# Patient Record
Sex: Male | Born: 1950
Health system: Southern US, Community
[De-identification: ages and names within clinical notes are randomized; demographics above are authoritative.]

## PROBLEM LIST (undated history)

## (undated) DIAGNOSIS — E785 Hyperlipidemia, unspecified: Secondary | ICD-10-CM

## (undated) DIAGNOSIS — I10 Essential (primary) hypertension: Secondary | ICD-10-CM

## (undated) HISTORY — DX: Hyperlipidemia, unspecified: E78.5

## (undated) HISTORY — DX: Essential (primary) hypertension: I10

---

## 2000-07-11 ENCOUNTER — Ambulatory Visit (HOSPITAL_COMMUNITY): Admission: RE | Admit: 2000-07-11 | Discharge: 2000-07-11 | Payer: Self-pay | Admitting: *Deleted

## 2004-07-15 ENCOUNTER — Emergency Department (HOSPITAL_COMMUNITY): Admission: EM | Admit: 2004-07-15 | Discharge: 2004-07-15 | Payer: Self-pay | Admitting: Emergency Medicine

## 2004-11-18 ENCOUNTER — Emergency Department (HOSPITAL_COMMUNITY): Admission: EM | Admit: 2004-11-18 | Discharge: 2004-11-18 | Payer: Self-pay | Admitting: Emergency Medicine

## 2014-10-16 ENCOUNTER — Encounter (HOSPITAL_COMMUNITY): Payer: Self-pay | Admitting: Emergency Medicine

## 2014-10-16 ENCOUNTER — Emergency Department (HOSPITAL_COMMUNITY)
Admission: EM | Admit: 2014-10-16 | Discharge: 2014-10-16 | Disposition: A | Discharge status: Home / Self Care | Payer: 59 | Attending: Emergency Medicine | Admitting: Emergency Medicine

## 2014-10-16 DIAGNOSIS — L739 Follicular disorder, unspecified: Secondary | ICD-10-CM | POA: Insufficient documentation

## 2014-10-16 DIAGNOSIS — L02413 Cutaneous abscess of right upper limb: Secondary | ICD-10-CM | POA: Diagnosis not present

## 2014-10-16 DIAGNOSIS — Z720 Tobacco use: Secondary | ICD-10-CM | POA: Diagnosis not present

## 2014-10-16 DIAGNOSIS — L0291 Cutaneous abscess, unspecified: Secondary | ICD-10-CM

## 2014-10-16 DIAGNOSIS — B356 Tinea cruris: Secondary | ICD-10-CM | POA: Diagnosis not present

## 2014-10-16 DIAGNOSIS — R21 Rash and other nonspecific skin eruption: Secondary | ICD-10-CM | POA: Diagnosis present

## 2014-10-16 MED ORDER — SODIUM BICARBONATE 4 % IV SOLN
5.0000 mL | Freq: Once | INTRAVENOUS | Status: AC
  Administered 2014-10-16: 5 mL via SUBCUTANEOUS
  Filled 2014-10-16: qty 5

## 2014-10-16 MED ORDER — TRAMADOL HCL 50 MG PO TABS: 50.0000 mg | ORAL_TABLET | Freq: Four times a day (QID) | ORAL | Status: DC | PRN

## 2014-10-16 MED ORDER — SULFAMETHOXAZOLE-TRIMETHOPRIM 800-160 MG PO TABS: 1.0000 | ORAL_TABLET | Freq: Two times a day (BID) | ORAL | Status: DC

## 2014-10-16 MED ORDER — TERBINAFINE HCL 1 % EX CREA: 1.0000 "application " | TOPICAL_CREAM | Freq: Two times a day (BID) | CUTANEOUS | Status: DC

## 2014-10-16 MED ORDER — CEPHALEXIN 500 MG PO CAPS: 500.0000 mg | ORAL_CAPSULE | Freq: Four times a day (QID) | ORAL | Status: DC

## 2014-10-16 MED ORDER — LIDOCAINE HCL (PF) 1 % IJ SOLN
10.0000 mL | Freq: Once | INTRAMUSCULAR | Status: AC
  Administered 2014-10-16: 10 mL
  Filled 2014-10-16: qty 10

## 2014-10-16 NOTE — ED Provider Notes (Signed)
CSN: [id number]     Arrival date & time 10/16/14  6387 History   First MD Initiated Contact with Patient 10/16/14 1021     Chief Complaint  Patient presents with  . Rash  . Abscess     (Consider location/radiation/quality/duration/timing/severity/associated sxs/prior Treatment) HPI Comments: Patient with c/o rash and abscess. Pmh of abscess and similar rash. Onset 1 week for both. Rash is located in the groin and is itchy, Patient thinks it's jock itch. He has not treated. He has not treated the rash before today. He has also developed associated painful pustules on the upper thighs. He has had similar rashes before Patient also c/o abscess of the R wrist. Developing over the past week. He states it began as a small pimple on the arm, but has been calm enlarged, painful with a dark center and has swelling extending around the wrist. He has a history of previous boils. He is unsure if he has a history of MRSA. He denies any known injuries, IV drug use, insect bites. He denies fevers, chills, nausea, vomiting, myalgias or other symptoms of systemic infection.  Patient is a 64 y.o. male presenting with rash and abscess. The history is provided by the patient.  Rash Location:  Ano-genital and leg Ano-genital rash location:  Groin Leg rash location:  L upper leg and R upper leg Quality: itchiness and redness   Severity:  Moderate Onset quality:  Gradual Duration:  1 week Timing:  Constant Progression:  Worsening Chronicity:  Recurrent Context: not animal contact, not chemical exposure, not diapers, not eggs, not exposure to similar rash, not food, not hot tub use, not insect bite/sting, not medications, not new detergent/soap, not nuts, not plant contact, not pollen, not pregnancy, not sick contacts and not sun exposure   Relieved by:  None tried Worsened by:  Nothing tried Associated symptoms: no abdominal pain, no diarrhea, no fatigue, no fever, no headaches, no hoarse voice, no  induration, no joint pain, no nausea, no shortness of breath, no sore throat, no throat swelling, no tongue swelling, no URI, not vomiting and not wheezing   Abscess Location:  Shoulder/arm Shoulder/arm abscess location:  R wrist Size:  2 cm Abscess quality: draining, fluctuance, induration, itching, painful, redness, warmth and weeping   Red streaking: no   Duration:  1 week Progression:  Worsening Pain details:    Quality:  Aching and pressure   Severity:  Moderate   Duration:  1 week   Timing:  Constant   Progression:  Worsening Chronicity:  New Context: not diabetes, not immunosuppression, not injected drug use, not insect bite/sting and not skin injury   Relieved by:  None tried Worsened by:  Nothing tried Ineffective treatments:  None tried Associated symptoms: no fatigue, no fever, no headaches, no nausea and no vomiting   Risk factors: prior abscess     History reviewed. No pertinent past medical history. History reviewed. No pertinent past surgical history. No family history on file. History  Substance Use Topics  . Smoking status: Current Every Day Smoker  . Smokeless tobacco: Not on file  . Alcohol Use: Yes    Review of Systems  Constitutional: Negative for fever and fatigue.  HENT: Negative for hoarse voice and sore throat.   Respiratory: Negative for shortness of breath and wheezing.   Gastrointestinal: Negative for nausea, vomiting, abdominal pain and diarrhea.  Musculoskeletal: Negative for arthralgias.  Skin: Positive for rash.  Neurological: Negative for headaches.  All other systems reviewed and  are negative.     Allergies  Review of patient's allergies indicates not on file.  Home Medications   Prior to Admission medications   Not on File   BP 149/77 mmHg  Pulse 76  Temp(Src) 98.1 F (36.7 C) (Oral)  Resp 16  Ht  (1.803 m)  Wt 175 lb (79.379 kg)  BMI 24.42 kg/m2  SpO2 98% Physical Exam  Constitutional: He appears well-developed  and well-nourished. No distress.  HENT:  Head: Normocephalic and atraumatic.  Eyes: Conjunctivae are normal. No scleral icterus.  Neck: Normal range of motion. Neck supple.  Cardiovascular: Normal rate, regular rhythm and normal heart sounds.   Pulmonary/Chest: Effort normal and breath sounds normal. No respiratory distress.  Abdominal: Soft. There is no tenderness.  Musculoskeletal: He exhibits no edema.       Right hand: Normal. He exhibits normal range of motion, no tenderness, normal capillary refill and no swelling. Normal sensation noted. Normal strength noted.  Neurological: He is alert.  Skin: Skin is warm and dry. He is not diaphoretic.     Psychiatric: His behavior is normal.  Nursing note and vitals reviewed.   ED Course  INCISION AND DRAINAGE Date/Time: 10/16/2014 12:44 PM Performed by: Arthor Captain Authorized by: Arthor Captain Consent: Verbal consent obtained. Risks and benefits: risks, benefits and alternatives were discussed Patient identity confirmed: provided demographic data Time out: Immediately prior to procedure a "time out" was called to verify the correct patient, procedure, equipment, support staff and site/side marked as required. Type: abscess Body area: upper extremity Location details: right wrist Anesthesia: local infiltration Local anesthetic: NaHCO3 (sodium bicarbonate) and lidocaine 1% without epinephrine Anesthetic total: 6 ml Patient sedated: no Scalpel size: 11 Incision type: single straight Complexity: simple Drainage characteristics: sebaceous and pururlent. Drainage amount: moderate Wound treatment: wound left open Packing material: none Patient tolerance: Patient tolerated the procedure well with no immediate complications   (including critical care time) Labs Review Labs Reviewed - No data to display  Imaging Review No results found.   EKG Interpretation None      MDM   Final diagnoses:  Abscess  Folliculitis    Tinea cruris    Patient with what appears to be staph infection of the thighs and tinea cruris. Abscess drained culture obtained and sent for eval. Lamisil, keflex, bactrim at discharge. Discussed return precautions     Arthor Captain, PA-C 10/18/14 0900  Jerelyn Scott, MD 10/18/14 [phone]

## 2014-10-16 NOTE — Discharge Instructions (Signed)
Abscess °An abscess is an infected area that contains a collection of pus and debris. It can occur in almost any part of the body. An abscess is also known as a furuncle or boil. °CAUSES  °An abscess occurs when tissue gets infected. This can occur from blockage of oil or sweat glands, infection of hair follicles, or a minor injury to the skin. As the body tries to fight the infection, pus collects in the area and creates pressure under the skin. This pressure causes pain. People with weakened immune systems have difficulty fighting infections and get certain abscesses more often.  °SYMPTOMS °Usually an abscess develops on the skin and becomes a painful mass that is red, warm, and tender. If the abscess forms under the skin, you may feel a moveable soft area under the skin. Some abscesses break open (rupture) on their own, but most will continue to get worse without care. The infection can spread deeper into the body and eventually into the bloodstream, causing you to feel ill.  °DIAGNOSIS  °Your caregiver will take your medical history and perform a physical exam. A sample of fluid may also be taken from the abscess to determine what is causing your infection. °TREATMENT  °Your caregiver may prescribe antibiotic medicines to fight the infection. However, taking antibiotics alone usually does not cure an abscess. Your caregiver may need to make a small cut (incision) in the abscess to drain the pus. In some cases, gauze is packed into the abscess to reduce pain and to continue draining the area. °HOME CARE INSTRUCTIONS  °· Only take over-the-counter or prescription medicines for pain, discomfort, or fever as directed by your caregiver. °· If you were prescribed antibiotics, take them as directed. Finish them even if you start to feel better. °· If gauze is used, follow your caregiver's directions for changing the gauze. °· To avoid spreading the infection: °· Keep your draining abscess covered with a  bandage. °· Wash your hands well. °· Do not share personal care items, towels, or whirlpools with others. °· Avoid skin contact with others. °· Keep your skin and clothes clean around the abscess. °· Keep all follow-up appointments as directed by your caregiver. °SEEK MEDICAL CARE IF:  °· You have increased pain, swelling, redness, fluid drainage, or bleeding. °· You have muscle aches, chills, or a general ill feeling. °· You have a fever. °MAKE SURE YOU:  °· Understand these instructions. °· Will watch your condition. °· Will get help right away if you are not doing well or get worse. °Document Released: 12/13/2004 Document Revised: 09/04/2011 Document Reviewed: 05/18/2011 °[employer]® Patient Information ©2015 [employer]. This information is not intended to replace advice given to you by your health care provider. Make sure you discuss any questions you have with your health care provider. ° °Cellulitis °Cellulitis is an infection of the skin and the tissue beneath it. The infected area is usually red and tender. Cellulitis occurs most often in the arms and lower legs.  °CAUSES  °Cellulitis is caused by bacteria that enter the skin through cracks or cuts in the skin. The most common types of bacteria that cause cellulitis are staphylococci and streptococci. °SIGNS AND SYMPTOMS  °· Redness and warmth. °· Swelling. °· Tenderness or pain. °· Fever. °DIAGNOSIS  °Your health care provider can usually determine what is wrong based on a physical exam. Blood tests may also be done. °TREATMENT  °Treatment usually involves taking an antibiotic medicine. °HOME CARE INSTRUCTIONS  °· Take your antibiotic   medicine as directed by your health care provider. Finish the antibiotic even if you start to feel better.  Keep the infected arm or leg elevated to reduce swelling.  Apply a warm cloth to the affected area up to 4 times per day to relieve pain.  Take medicines only as directed by your health care provider.  Keep all  follow-up visits as directed by your health care provider. SEEK MEDICAL CARE IF:   You notice red streaks coming from the infected area.  Your red area gets larger or turns dark in color.  Your bone or joint underneath the infected area becomes painful after the skin has healed.  Your infection returns in the same area or another area.  You notice a swollen bump in the infected area.  You develop new symptoms.  You have a fever. SEEK IMMEDIATE MEDICAL CARE IF:   You feel very sleepy.  You develop vomiting or diarrhea.  You have a general ill feeling (malaise) with muscle aches and pains. MAKE SURE YOU:   Understand these instructions.  Will watch your condition.  Will get help right away if you are not doing well or get worse. Document Released: 12/13/2004 Document Revised: 07/20/2013 Document Reviewed: 05/21/2011 [facility] Patient Information 2015 [place], [place]. This information is not intended to replace advice given to you by your health care provider. Make sure you discuss any questions you have with your health care provider.  Folliculitis  Folliculitis is redness, soreness, and swelling (inflammation) of the hair follicles. This condition can occur anywhere on the body. People with weakened immune systems, diabetes, or obesity have a greater risk of getting folliculitis. CAUSES  Bacterial infection. This is the most common cause.  Fungal infection.  Viral infection.  Contact with certain chemicals, especially oils and tars. Long-term folliculitis can result from bacteria that live in the nostrils. The bacteria may trigger multiple outbreaks of folliculitis over time. SYMPTOMS Folliculitis most commonly occurs on the scalp, thighs, legs, back, buttocks, and areas where hair is shaved frequently. An early sign of folliculitis is a small, white or yellow, pus-filled, itchy lesion (pustule). These lesions appear on a red, inflamed follicle. They are usually less than  0.2 inches (5 mm) wide. When there is an infection of the follicle that goes deeper, it becomes a boil or furuncle. A group of closely packed boils creates a larger lesion (carbuncle). Carbuncles tend to occur in hairy, sweaty areas of the body. DIAGNOSIS  Your caregiver can usually tell what is wrong by doing a physical exam. A sample may be taken from one of the lesions and tested in a lab. This can help determine what is causing your folliculitis. TREATMENT  Treatment may include:  Applying warm compresses to the affected areas.  Taking antibiotic medicines orally or applying them to the skin.  Draining the lesions if they contain a large amount of pus or fluid.  Laser hair removal for cases of long-lasting folliculitis. This helps to prevent regrowth of the hair. HOME CARE INSTRUCTIONS  Apply warm compresses to the affected areas as directed by your caregiver.  If antibiotics are prescribed, take them as directed. Finish them even if you start to feel better.  You may take over-the-counter medicines to relieve itching.  Do not shave irritated skin.  Follow up with your caregiver as directed. SEEK IMMEDIATE MEDICAL CARE IF:   You have increasing redness, swelling, or pain in the affected area.  You have a fever. MAKE SURE YOU:  Understand  these instructions.  Will watch your condition.  Will get help right away if you are not doing well or get worse. Document Released: 05/14/2001 Document Revised: 09/04/2011 Document Reviewed: 06/05/2011 [facility] Patient Information 2015 [place], [place]. This information is not intended to replace advice given to you by your health care provider. Make sure you discuss any questions you have with your health care provider.  Incision and Drainage Incision and drainage is a procedure in which a sac-like structure (cystic structure) is opened and drained. The area to be drained usually contains material such as pus, fluid, or blood.  LET YOUR  CAREGIVER KNOW ABOUT:   Allergies to medicine.  Medicines taken, including vitamins, herbs, eyedrops, over-the-counter medicines, and creams.  Use of steroids (by mouth or creams).  Previous problems with anesthetics or numbing medicines.  History of bleeding problems or blood clots.  Previous surgery.  Other health problems, including diabetes and kidney problems.  Possibility of pregnancy, if this applies. RISKS AND COMPLICATIONS  Pain.  Bleeding.  Scarring.  Infection. BEFORE THE PROCEDURE  You may need to have an ultrasound or other imaging tests to see how large or deep your cystic structure is. Blood tests may also be used to determine if you have an infection or how severe the infection is. You may need to have a tetanus shot. PROCEDURE  The affected area is cleaned with a cleaning fluid. The cyst area will then be numbed with a medicine (local anesthetic). A small incision will be made in the cystic structure. A syringe or catheter may be used to drain the contents of the cystic structure, or the contents may be squeezed out. The area will then be flushed with a cleansing solution. After cleansing the area, it is often gently packed with a gauze or another wound dressing. Once it is packed, it will be covered with gauze and tape or some other type of wound dressing. AFTER THE PROCEDURE   Often, you will be allowed to go home right after the procedure.  You may be given antibiotic medicine to prevent or heal an infection.  If the area was packed with gauze or some other wound dressing, you will likely need to come back in 1 to 2 days to get it removed.  The area should heal in about 14 days. Document Released: 08/29/2000 Document Revised: 09/04/2011 Document Reviewed: 04/30/2011 [facility] Patient Information 2015 [place], [place]. This information is not intended to replace advice given to you by your health care provider. Make sure you discuss any questions you have  with your health care provider.  Jock Itch Jock itch is a fungal infection of the skin in the groin area. It is sometimes called "ringworm" even though it is not caused by a worm. A fungus is a type of germ that thrives in dark, damp places.  CAUSES  This infection may spread from:  A fungus infection elsewhere on the body (such as athlete's foot).  Sharing towels or clothing. This infection is more common in:  Hot, humid climates.  People who wear tight-fitting clothing or wet bathing suits for long periods of time.  Athletes.  Overweight people.  People with diabetes. SYMPTOMS  Jock itch causes the following symptoms:  Red, pink or brown rash in the groin. Rash may spread to the thighs, anus, and buttocks.  Itching. DIAGNOSIS  Your caregiver may make the diagnosis by looking at the rash. Sometimes a skin scraping will be sent to test for fungus. Testing can be  done either by looking under the microscope or by doing a culture (test to try to grow the fungus). A culture can take up to 2 weeks to come back. TREATMENT  Jock itch may be treated with:  Skin cream or ointment to kill fungus.  Medicine by mouth to kill fungus.  Skin cream or ointment to calm the itching.  Compresses or medicated powders to dry the infected skin. HOME CARE INSTRUCTIONS   Be sure to treat the rash completely. Follow your caregiver's instructions. It can take a couple of weeks to treat. If you do not treat the infection long enough, the rash can come back.  Wear loose-fitting clothing.  Men should wear cotton boxer shorts.  Women should wear cotton underwear.  Avoid hot baths.  Dry the groin area well after bathing. SEEK MEDICAL CARE IF:   Your rash is worse.  Your rash is spreading.  Your rash returns after treatment is finished.  Your rash is not gone in 4 weeks. Fungal infections are slow to respond to treatment. Some redness may remain for several weeks after the fungus is  gone. SEEK IMMEDIATE MEDICAL CARE IF:  The area becomes red, warm, tender, and swollen.  You have a fever. Document Released: 02/23/2002 Document Revised: 05/28/2011 Document Reviewed: 01/23/2008 [facility] Patient Information 2015 [place], [place]. This information is not intended to replace advice given to you by your health care provider. Make sure you discuss any questions you have with your health care provider.

## 2014-10-16 NOTE — ED Notes (Signed)
Pt. Stated, I have this rash on my inner legs and I have a boil on my rt. wrist.

## 2014-10-19 LAB — CULTURE, ROUTINE-ABSCESS

## 2014-10-20 ENCOUNTER — Telehealth (HOSPITAL_COMMUNITY): Payer: Self-pay

## 2014-10-20 NOTE — Telephone Encounter (Signed)
Post ED Visit - Positive Culture Follow-up  Culture report reviewed by antimicrobial stewardship pharmacist:  Wes Dulaney, Pharm.D., BCPS  Celedonio Miyamoto, [address].D., BCPS  Georgina Pillion, Pharm.D., BCPS  [place], [address].D., BCPS, AAHIVP  Estella Husk, Pharm.D., BCPS, AAHIVP  Elder Cyphers, [address].D., BCPS X  [facility]  Positive absces culture Treated with bactrim ds, organism sensitive to the same and no further patient follow-up is required at this time.  Ashley Jacobs 10/20/2014, 8:38 AM

## 2018-10-31 ENCOUNTER — Other Ambulatory Visit: Payer: Self-pay | Admitting: Family Medicine

## 2018-10-31 DIAGNOSIS — Z136 Encounter for screening for cardiovascular disorders: Secondary | ICD-10-CM

## 2018-11-14 ENCOUNTER — Ambulatory Visit
Admission: RE | Admit: 2018-11-14 | Discharge: 2018-11-14 | Disposition: A | Discharge status: Home / Self Care | Payer: Medicare Other | Source: Ambulatory Visit | Attending: Family Medicine | Admitting: Family Medicine

## 2018-11-14 DIAGNOSIS — Z136 Encounter for screening for cardiovascular disorders: Secondary | ICD-10-CM

## 2018-12-15 ENCOUNTER — Telehealth: Payer: Self-pay | Admitting: Cardiology

## 2018-12-15 NOTE — Telephone Encounter (Signed)
LVM for patient to call and schedule new patient appt with Dr. Gardiner Rhyme on 12-24-18 at 11:40 with Dr. Gardiner Rhyme.

## 2019-02-27 ENCOUNTER — Ambulatory Visit (INDEPENDENT_AMBULATORY_CARE_PROVIDER_SITE_OTHER): Payer: Medicare Other | Admitting: Cardiology

## 2019-02-27 ENCOUNTER — Other Ambulatory Visit: Payer: Self-pay

## 2019-02-27 ENCOUNTER — Encounter (INDEPENDENT_AMBULATORY_CARE_PROVIDER_SITE_OTHER): Payer: Self-pay

## 2019-02-27 ENCOUNTER — Encounter: Payer: Self-pay | Admitting: Cardiology

## 2019-02-27 VITALS — BP 131/88 | HR 66 | Ht 68.0 in | Wt 185.4 lb

## 2019-02-27 DIAGNOSIS — R072 Precordial pain: Secondary | ICD-10-CM

## 2019-02-27 DIAGNOSIS — R079 Chest pain, unspecified: Secondary | ICD-10-CM

## 2019-02-27 DIAGNOSIS — I10 Essential (primary) hypertension: Secondary | ICD-10-CM

## 2019-02-27 DIAGNOSIS — Z720 Tobacco use: Secondary | ICD-10-CM | POA: Diagnosis not present

## 2019-02-27 DIAGNOSIS — E785 Hyperlipidemia, unspecified: Secondary | ICD-10-CM

## 2019-02-27 MED ORDER — AMLODIPINE BESYLATE 10 MG PO TABS: 10.0000 mg | ORAL_TABLET | Freq: Every day | ORAL | 3 refills | Status: AC

## 2019-02-27 MED ORDER — METOPROLOL TARTRATE 100 MG PO TABS: 100.0000 mg | ORAL_TABLET | ORAL | 0 refills | Status: AC

## 2019-02-27 MED ORDER — ROSUVASTATIN CALCIUM 20 MG PO TABS: 20.0000 mg | ORAL_TABLET | Freq: Every day | ORAL | 3 refills | Status: DC

## 2019-02-27 NOTE — Patient Instructions (Signed)
Medication Instructions:  Your physician has recommended you make the following change in your medication:  1. Increase Amlodipine one tablet (10 mg) daily, you can use up your (5mg ) tablets taking two at a time. 2. Start Crestor one tablet (20 mg) daily, all scripts sent in today for #90 to requested pharmacy.   *If you need a refill on your cardiac medications before your next appointment, please call your pharmacy*  Lab Work: -None  If you have labs (blood work) drawn today and your tests are completely normal, you will receive your results only by: MyChart Message (if you have MyChart) OR . A paper copy in the mail If you have any lab test that is abnormal or we need to change your treatment, we will call you to review the results.  Testing/Procedures: Your physician has requested that you have an echocardiogram. Echocardiography is a painless test that uses sound waves to create images of your heart. It provides your doctor with information about the size and shape of your heart and how well your heart's chambers and valves are working. This procedure takes approximately one hour. There are no restrictions for this procedure.    Follow-Up: At [facility], you and your health needs are our priority.  As part of our continuing mission to provide you with exceptional heart care, we have created designated Provider Care Teams.  These Care Teams include your primary Cardiologist (physician) and Advanced Practice Providers (APPs -  Physician Assistants and Nurse Practitioners) who all work together to provide you with the care you need, when you need it.  Your next appointment:   3 month(s)  The format for your next appointment:   In Person  Provider:   [facility], MD  Other Instructions Your cardiac CT will be scheduled at one of the below locations:   [facility] [address] [place], [place] [id number]  OR  [facility] [address] [place], [place] [phone]  If scheduled at [facility], please arrive at the [facility] main entrance of [facility] 30-45 minutes prior to test start time. Proceed to the [facility] Radiology Department (first floor) to check-in and test prep.  If scheduled at [facility], please arrive 15 mins early for check-in and test prep.  Please follow these instructions carefully (unless otherwise directed):  Hold all erectile dysfunction medications at least 3 days (72 hrs) prior to test.  On the Night Before the Test: . Be sure to Drink plenty of water. . Do not consume any caffeinated/decaffeinated beverages or chocolate 12 hours prior to your test. . Do not take any antihistamines 12 hours prior to your test.  On the Day of the Test: . Drink plenty of water. Do not drink any water within one hour of the test. . Do not eat any food 4 hours prior to the test. . You may take your regular medications prior to the test.  . Take metoprolol (Lopressor) two hours prior to test. .  .       After the Test: . Drink plenty of water. . After receiving IV contrast, you may experience a mild flushed feeling. This is normal. . On occasion, you may experience a mild rash up to 24 hours after the test. This is not dangerous. If this occurs, you can take Benadryl 25 mg and increase your fluid intake. . If you experience trouble breathing,  this can be serious. If it is severe call 911 IMMEDIATELY. If it is mild, please call our office. . If you take any of these medications: Glipizide/Metformin, Avandament, Glucavance, please do not take 48 hours after completing test unless otherwise instructed.   Once we have confirmed authorization from your insurance company, we will call you to set up a date and time for your test.   For non-scheduling related questions, please contact the cardiac imaging  nurse navigator should you have any questions/concerns: Marchia Bond, RN Navigator Cardiac Imaging [facility] [phone] Office   Keep a check on your BP monitor everyday and in two weeks call office or send a mychart message with readings.   Blood Pressure Record Sheet To take your blood pressure, you will need a blood pressure machine. You can buy a blood pressure machine (blood pressure monitor) at your clinic, drug store, or online. When choosing one, consider:  An automatic monitor that has an arm cuff.  A cuff that wraps snugly around your upper arm. You should be able to fit only one finger between your arm and the cuff.  A device that stores blood pressure reading results.  Do not choose a monitor that measures your blood pressure from your wrist or finger. Follow your health care provider's instructions for how to take your blood pressure. To use this form:  Get one reading in the morning (a.m.) before you take any medicines.  Get one reading in the evening (p.m.) before supper.  Take at least 2 readings with each blood pressure check. This makes sure the results are correct. Wait 1-2 minutes between measurements.  Write down the results in the spaces on this form.  Repeat this once a week, or as told by your health care provider.  Make a follow-up appointment with your health care provider to discuss the results. Blood pressure log Date: _______________________  a.m. _____________________(1st reading) _____________________(2nd reading)  p.m. _____________________(1st reading) _____________________(2nd reading) Date: _______________________  a.m. _____________________(1st reading) _____________________(2nd reading)  p.m. _____________________(1st reading) _____________________(2nd reading) Date: _______________________  a.m. _____________________(1st reading) _____________________(2nd reading)  p.m. _____________________(1st  reading) _____________________(2nd reading) Date: _______________________  a.m. _____________________(1st reading) _____________________(2nd reading)  p.m. _____________________(1st reading) _____________________(2nd reading) Date: _______________________  a.m. _____________________(1st reading) _____________________(2nd reading)  p.m. _____________________(1st reading) _____________________(2nd reading) This information is not intended to replace advice given to you by your health care provider. Make sure you discuss any questions you have with your health care provider. Document Released: 12/02/2002 Document Revised: 05/03/2017 Document Reviewed: 03/05/2017 Elsevier Patient Education  2020 [employer].

## 2019-02-27 NOTE — Progress Notes (Signed)
Cardiology Office Note:    Date:  02/27/2019   ID:  Edward Moran, DOB 10-May-1950, MRN [id number]  PCP:  System, Provider Not In  Cardiologist:  No primary care provider on file.  Electrophysiologist:  None   Referring MD: Charlott RakesSchooler, Vincent, MD   Chief Complaint  Patient presents with  . Chest Pain    History of Present Illness:    Edward Moran is a 68 y.o. male with a hx of  hyperlipidemia, hypertension, tobacco use who is referred by Dr. Bosie ClosSchooler for an evaluation of chest pain.  See his Dr. Bosie ClosSchooler for GERD, planning EGD/colonoscopy.  However reported having chest pain she was referred to cardiology for evaluation.  He states that chest pain started at least 1 year ago.  Describes constant aching pain on left side of his chest.  2-3 out of 10 in intensity.  No clear relationship with exertion.  Checks his BP at home, has been around 140/80.  He smoked for 45 years x 1 pack/day, quit in September.  Now smoking 2 cigars daily.  Father had stroke in 2840s.  Past Medical History:  Diagnosis Date  . Hyperlipidemia   . Hypertension     No past surgical history on file.  Current Medications: Current Meds  Medication Sig  . amLODipine (NORVASC) 10 MG tablet Take 1 tablet (10 mg total) by mouth daily.  . lansoprazole (PREVACID) 15 MG capsule Take 15 mg by mouth daily as needed.  Marland Kitchen. losartan (COZAAR) 100 MG tablet   . [DISCONTINUED] amLODipine (NORVASC) 5 MG tablet      Allergies:   Patient has no known allergies.   Social History   Socioeconomic History  . Marital status: Married    Spouse name: Not on file  . Number of children: Not on file  . Years of education: Not on file  . Highest education level: Not on file  Occupational History  . Not on file  Tobacco Use  . Smoking status: Current Every Day Smoker    Types: Cigars  . Smokeless tobacco: Never Used  Substance and Sexual Activity  . Alcohol use: Yes  . Drug use: No  . Sexual activity: Not on file  Other  Topics Concern  . Not on file  Social History Narrative  . Not on file   Social Determinants of Health   Financial Resource Strain:   . Difficulty of Paying Living Expenses: Not on file  Food Insecurity:   . Worried About Programme researcher, broadcasting/film/videounning Out of Food in the Last Year: Not on file  . Ran Out of Food in the Last Year: Not on file  Transportation Needs:   . Lack of Transportation (Medical): Not on file  . Lack of Transportation (Non-Medical): Not on file  Physical Activity:   . Days of Exercise per Week: Not on file  . Minutes of Exercise per Session: Not on file  Stress:   . Feeling of Stress : Not on file  Social Connections:   . Frequency of Communication with Friends and Family: Not on file  . Frequency of Social Gatherings with Friends and Family: Not on file  . Attends Religious Services: Not on file  . Active Member of Clubs or Organizations: Not on file  . Attends BankerClub or Organization Meetings: Not on file  . Marital Status: Not on file     Family History: Father had stroke in 3240s  ROS:   Please see the history of present illness.  All other systems reviewed and are negative.  EKGs/Labs/Other Studies Reviewed:    The following studies were reviewed today:   EKG:  EKG is  ordered today.  The ekg ordered today demonstrates normal sinus rhythm, rate 77, no ST/T abnormality  Recent Labs: No results found for requested labs within last 8760 hours.  Recent Lipid Panel No results found for: CHOL, TRIG, HDL, CHOLHDL, VLDL, LDLCALC, LDLDIRECT  Physical Exam:    VS:  BP 131/88 (BP Location: Right Arm, Cuff Size: Normal)   Pulse 66   Ht 5\' 8"  (1.727 m)   Wt 185 lb 6.4 oz (84.1 kg)   BMI 28.19 kg/m     Wt Readings from Last 3 Encounters:  02/27/19 185 lb 6.4 oz (84.1 kg)  10/16/14 175 lb (79.4 kg)     GEN:  Well nourished, well developed in no acute distress HEENT: Normal NECK: No JVD LYMPHATICS: No lymphadenopathy CARDIAC: RRR, no murmurs, rubs,  gallops RESPIRATORY:  Clear to auscultation without rales, wheezing or rhonchi  ABDOMEN: Soft, non-tender, non-distended MUSCULOSKELETAL:  No edema; No deformity  SKIN: Warm and dry NEUROLOGIC:  Alert and oriented x 3 PSYCHIATRIC:  Normal affect   ASSESSMENT:    1. Chest pain, unspecified type   2. Essential hypertension   3. Hyperlipidemia, unspecified hyperlipidemia type   4. Tobacco use    PLAN:    In order of problems listed above:  Chest pain: Description is atypical, but patient has significant risk factors for coronary disease (smoking history, hypertension, hyperlipidemia).  Overall would classify as intermediate risk of obstructive coronary disease -Coronary CTA -TTE  Hypertension: On amlodipine 5 mg daily and losartan 100 mg daily.  Appears uncontrolled, will increase amlodipine to 10 mg daily  Hyperlipidemia: LDL 182 on 11/07/2018.  10-year ASCVD risk score is 35%.  Start rosuvastatin 20 mg daily  Tobacco use: Smoked 1 pack/day x 45 years, quit in September but now smoking 2 cigars/day.  Patient counseled on the risk of smoking cessation strongly encouraged  RTC in 3 months  Medication Adjustments/Labs and Tests Ordered: Current medicines are reviewed at length with the patient today.  Concerns regarding medicines are outlined above.  Orders Placed This Encounter  Procedures  . CT CORONARY MORPH W/CTA COR W/SCORE W/CA W/CM &/OR WO/CM  . CT CORONARY FRACTIONAL FLOW RESERVE DATA PREP  . CT CORONARY FRACTIONAL FLOW RESERVE FLUID ANALYSIS  . EKG 12-Lead  . ECHOCARDIOGRAM COMPLETE   Meds ordered this encounter  Medications  . amLODipine (NORVASC) 10 MG tablet    Sig: Take 1 tablet (10 mg total) by mouth daily.    Dispense:  90 tablet    Refill:  3  . rosuvastatin (CRESTOR) 20 MG tablet    Sig: Take 1 tablet (20 mg total) by mouth daily.    Dispense:  90 tablet    Refill:  3  . metoprolol tartrate (LOPRESSOR) 100 MG tablet    Sig: Take 1 tablet (100 mg total)  by mouth as directed. 2 hours prior to CTA    Dispense:  1 tablet    Refill:  0    Patient Instructions  Medication Instructions:  Your physician has recommended you make the following change in your medication:  1. Increase Amlodipine one tablet (10 mg) daily, you can use up your (5mg ) tablets taking two at a time. 2. Start Crestor one tablet (20 mg) daily, all scripts sent in today for #90 to requested pharmacy.   *If you need a  refill on your cardiac medications before your next appointment, please call your pharmacy*  Lab Work: -None  If you have labs (blood work) drawn today and your tests are completely normal, you will receive your results only by: Marland Kitchen MyChart Message (if you have MyChart) OR . A paper copy in the mail If you have any lab test that is abnormal or we need to change your treatment, we will call you to review the results.  Testing/Procedures: Your physician has requested that you have an echocardiogram. Echocardiography is a painless test that uses sound waves to create images of your heart. It provides your doctor with information about the size and shape of your heart and how well your heart's chambers and valves are working. This procedure takes approximately one hour. There are no restrictions for this procedure.    Follow-Up: At [facility], you and your health needs are our priority.  As part of our continuing mission to provide you with exceptional heart care, we have created designated Provider Care Teams.  These Care Teams include your primary Cardiologist (physician) and Advanced Practice Providers (APPs -  Physician Assistants and Nurse Practitioners) who all work together to provide you with the care you need, when you need it.  Your next appointment:   3 month(s)  The format for your next appointment:   In Person  Provider:   Epifanio Lesches, MD  Other Instructions Your cardiac CT will be scheduled at one of the below locations:    [facility] [address] [place], [place] [address] [phone]  OR  [facility] [address] [place], [place] [phone]  If scheduled at [facility], please arrive at the [facility] main entrance of [facility] 30-45 minutes prior to test start time. Proceed to the [facility] Radiology Department (first floor) to check-in and test prep.  If scheduled at [facility], please arrive 15 mins early for check-in and test prep.  Please follow these instructions carefully (unless otherwise directed):  Hold all erectile dysfunction medications at least 3 days (72 hrs) prior to test.  On the Night Before the Test: . Be sure to Drink plenty of water. . Do not consume any caffeinated/decaffeinated beverages or chocolate 12 hours prior to your test. . Do not take any antihistamines 12 hours prior to your test.  On the Day of the Test: . Drink plenty of water. Do not drink any water within one hour of the test. . Do not eat any food 4 hours prior to the test. . You may take your regular medications prior to the test.  . Take metoprolol (Lopressor) two hours prior to test. .  .       After the Test: . Drink plenty of water. . After receiving IV contrast, you may experience a mild flushed feeling. This is normal. . On occasion, you may experience a mild rash up to 24 hours after the test. This is not dangerous. If this occurs, you can take Benadryl 25 mg and increase your fluid intake. . If you experience trouble breathing, this can be serious. If it is severe call 911 IMMEDIATELY. If it is mild, please call our office. . If you take any of these medications: Glipizide/Metformin, Avandament, Glucavance, please do not take 48 hours after completing test unless otherwise instructed.   Once we have confirmed authorization from your insurance company, we will call you to  set  up a date and time for your test.   For non-scheduling related questions, please contact the cardiac imaging nurse navigator should you have any questions/concerns: Marchia Bond, RN Navigator Cardiac Imaging [facility] [phone] Office   Keep a check on your BP monitor everyday and in two weeks call office or send a mychart message with readings.   Blood Pressure Record Sheet To take your blood pressure, you will need a blood pressure machine. You can buy a blood pressure machine (blood pressure monitor) at your clinic, drug store, or online. When choosing one, consider:  An automatic monitor that has an arm cuff.  A cuff that wraps snugly around your upper arm. You should be able to fit only one finger between your arm and the cuff.  A device that stores blood pressure reading results.  Do not choose a monitor that measures your blood pressure from your wrist or finger. Follow your health care provider's instructions for how to take your blood pressure. To use this form:  Get one reading in the morning (a.m.) before you take any medicines.  Get one reading in the evening (p.m.) before supper.  Take at least 2 readings with each blood pressure check. This makes sure the results are correct. Wait 1-2 minutes between measurements.  Write down the results in the spaces on this form.  Repeat this once a week, or as told by your health care provider.  Make a follow-up appointment with your health care provider to discuss the results. Blood pressure log Date: _______________________  a.m. _____________________(1st reading) _____________________(2nd reading)  p.m. _____________________(1st reading) _____________________(2nd reading) Date: _______________________  a.m. _____________________(1st reading) _____________________(2nd reading)  p.m. _____________________(1st reading) _____________________(2nd reading) Date:  _______________________  a.m. _____________________(1st reading) _____________________(2nd reading)  p.m. _____________________(1st reading) _____________________(2nd reading) Date: _______________________  a.m. _____________________(1st reading) _____________________(2nd reading)  p.m. _____________________(1st reading) _____________________(2nd reading) Date: _______________________  a.m. _____________________(1st reading) _____________________(2nd reading)  p.m. _____________________(1st reading) _____________________(2nd reading) This information is not intended to replace advice given to you by your health care provider. Make sure you discuss any questions you have with your health care provider. Document Released: 12/02/2002 Document Revised: 05/03/2017 Document Reviewed: 03/05/2017 Elsevier Patient Education  2020 [place], Donato Heinz, MD  02/27/2019 5:52 PM    [place]

## 2019-03-10 ENCOUNTER — Ambulatory Visit (HOSPITAL_COMMUNITY): Payer: Medicare Other | Attending: Cardiovascular Disease

## 2019-03-10 ENCOUNTER — Other Ambulatory Visit: Payer: Self-pay

## 2019-03-10 DIAGNOSIS — R079 Chest pain, unspecified: Secondary | ICD-10-CM

## 2019-03-23 ENCOUNTER — Other Ambulatory Visit: Payer: Self-pay | Admitting: *Deleted

## 2019-03-23 DIAGNOSIS — R079 Chest pain, unspecified: Secondary | ICD-10-CM

## 2019-04-16 ENCOUNTER — Encounter (HOSPITAL_COMMUNITY): Payer: Self-pay

## 2019-04-17 ENCOUNTER — Telehealth (HOSPITAL_COMMUNITY): Payer: Self-pay | Admitting: Emergency Medicine

## 2019-04-17 NOTE — Telephone Encounter (Signed)
Reaching out to patient to offer assistance regarding upcoming cardiac imaging study; pt verbalizes understanding of appt date/time, parking situation and where to check in, pre-test NPO status and medications ordered, and verified current allergies; name and call back number provided for further questions should they arise Makyla Bye RN Navigator Cardiac Imaging [facility] [phone] office [phone] cell 

## 2019-04-20 ENCOUNTER — Ambulatory Visit (HOSPITAL_COMMUNITY)
Admission: RE | Admit: 2019-04-20 | Discharge: 2019-04-20 | Disposition: A | Discharge status: Home / Self Care | Payer: Medicare Other | Source: Ambulatory Visit | Attending: Cardiology | Admitting: Cardiology

## 2019-04-20 ENCOUNTER — Other Ambulatory Visit: Payer: Self-pay

## 2019-04-20 DIAGNOSIS — R072 Precordial pain: Secondary | ICD-10-CM | POA: Diagnosis not present

## 2019-04-20 DIAGNOSIS — R079 Chest pain, unspecified: Secondary | ICD-10-CM | POA: Diagnosis present

## 2019-04-20 DIAGNOSIS — I2510 Atherosclerotic heart disease of native coronary artery without angina pectoris: Secondary | ICD-10-CM | POA: Diagnosis not present

## 2019-04-20 LAB — POCT I-STAT CREATININE: Creatinine, Ser: 1.1 mg/dL (ref 0.61–1.24)

## 2019-04-20 MED ORDER — IOHEXOL 350 MG/ML SOLN
80.0000 mL | Freq: Once | INTRAVENOUS | Status: AC | PRN
  Administered 2019-04-20: 80 mL via INTRAVENOUS

## 2019-04-20 MED ORDER — NITROGLYCERIN 0.4 MG SL SUBL
SUBLINGUAL_TABLET | SUBLINGUAL | Status: AC
  Filled 2019-04-20: qty 2

## 2019-04-20 MED ORDER — NITROGLYCERIN 0.4 MG SL SUBL
0.8000 mg | SUBLINGUAL_TABLET | Freq: Once | SUBLINGUAL | Status: AC
  Administered 2019-04-20: 09:00:00 0.8 mg via SUBLINGUAL

## 2019-04-23 DIAGNOSIS — R072 Precordial pain: Secondary | ICD-10-CM | POA: Diagnosis not present

## 2019-06-01 NOTE — Progress Notes (Signed)
Cardiology Office Note:    Date:  06/05/2019   ID:  Edward Moran, DOB [birth year], MRN [id number]  PCP:  System, Provider Not In  Cardiologist:  No primary care provider on file.  Electrophysiologist:  None   Referring MD: No ref. provider found   Chief Complaint  Patient presents with  . Coronary Artery Disease    History of Present Illness:    Edward Moran is a 69 y.o. male with a hx of nonobstructive CAD, hyperlipidemia, hypertension, tobacco use who presents for follow-up.  He was referred by Dr. Bosie Clos for an evaluation of chest pain, intially seen on 02/27/2019.  Seen by Dr. Bosie Clos for GERD, had planned EGD/colonoscopy.  However reported having chest pain so was referred to cardiology for evaluation.  He states that chest pain started at least 1 year prior.  Describes constant aching pain on left side of his chest.  2-3 out of 10 in intensity.  No clear relationship with exertion.  Checks his BP at home, has been around 140/80.  He smoked for 45 years x 1 pack/day, quit in September 2020.  Now smoking 2 cigars daily.  Father had stroke in 19s.  TTE was done on 03/10/2019, which showed normal LV systolic function, grade 1 diastolic dysfunction, normal RV function, no significant valvular disease.  Coronary CTA 04/23/2019 showed nonobstructive CAD, with most significant lesion being mixed plaque in mid LAD causing intermediate (50 to 69%) stenosis, CT FFR 0.85.  Since his last clinic visit, he reports that he has been doing well.  He continues to have chest pain, occurring about once per month.  Describes sharp left-sided chest pain, no relationship with exertion.  Has been taking lansoprazole and noted improvement in chest pain.  Continues to smoke 2 cigars/day.  Has been taking rosuvastatin, denies any issues.   Past Medical History:  Diagnosis Date  . Hyperlipidemia   . Hypertension     No past surgical history on file.  Current Medications: Current Meds  Medication Sig    . amLODipine (NORVASC) 10 MG tablet Take 1 tablet (10 mg total) by mouth daily.  . lansoprazole (PREVACID) 15 MG capsule Take 15 mg by mouth daily as needed.  Marland Kitchen losartan (COZAAR) 100 MG tablet   . metoprolol tartrate (LOPRESSOR) 100 MG tablet Take 1 tablet (100 mg total) by mouth as directed. 2 hours prior to CTA  . [DISCONTINUED] rosuvastatin (CRESTOR) 20 MG tablet Take 1 tablet (20 mg total) by mouth daily.     Allergies:   Patient has no known allergies.   Social History   Socioeconomic History  . Marital status: Married    Spouse name: Not on file  . Number of children: Not on file  . Years of education: Not on file  . Highest education level: Not on file  Occupational History  . Not on file  Tobacco Use  . Smoking status: Current Every Day Smoker    Types: Cigars  . Smokeless tobacco: Never Used  Substance and Sexual Activity  . Alcohol use: Yes  . Drug use: No  . Sexual activity: Not on file  Other Topics Concern  . Not on file  Social History Narrative  . Not on file   Social Determinants of Health   Financial Resource Strain:   . Difficulty of Paying Living Expenses:   Food Insecurity:   . Worried About Programme researcher, broadcasting/film/video in the Last Year:   . [employer] of Food in the  Last Year:   Transportation Needs:   . Freight forwarder (Medical):   Marland Kitchen Lack of Transportation (Non-Medical):   Physical Activity:   . Days of Exercise per Week:   . Minutes of Exercise per Session:   Stress:   . Feeling of Stress :   Social Connections:   . Frequency of Communication with Friends and Family:   . Frequency of Social Gatherings with Friends and Family:   . Attends Religious Services:   . Active Member of Clubs or Organizations:   . Attends Banker Meetings:   Marland Kitchen Marital Status:      Family History: Father had stroke in 72s  ROS:   Please see the history of present illness.     All other systems reviewed and are negative.  EKGs/Labs/Other Studies  Reviewed:    The following studies were reviewed today:   EKG:  EKG is not ordered today.  The ekg ordered most recently demonstrates normal sinus rhythm, rate 77, no ST/T abnormality  TTE 03/10/19: 1. Left ventricular ejection fraction, by visual estimation, is 60 to  65%. The left ventricle has normal function. There is no left ventricular  hypertrophy.  2. Left ventricular diastolic parameters are consistent with Grade I  diastolic dysfunction (impaired relaxation).  3. The left ventricle has no regional wall motion abnormalities.  4. Global right ventricle has normal systolic function.The right  ventricular size is normal. No increase in right ventricular wall  thickness.  5. Left atrial size was normal.  6. Right atrial size was normal.  7. The mitral valve is normal in structure. No evidence of mitral valve  regurgitation. No evidence of mitral stenosis.  8. The tricuspid valve is normal in structure. Tricuspid valve  regurgitation is not demonstrated.  9. The aortic valve is normal in structure. Aortic valve regurgitation is  not visualized. No evidence of aortic valve sclerosis or stenosis.  10. The pulmonic valve was normal in structure. Pulmonic valve  regurgitation is not visualized.  11. Mildly elevated pulmonary artery systolic pressure.  12. The inferior vena cava is normal in size with greater than 50%  respiratory variability, suggesting right atrial pressure of 3 mmHg.   Coronary CTA 04/23/19: 1. Coronary calcium score of 95. This was 49th percentile for age and sex matched control. 2. Normal coronary origin with right dominance. 3. Nonobstructive CAD. Most significant lesion is mixed plaque in mid LAD causing intermediate (50-69%) stenosis.  CAD-RADS 3. Moderate stenosis. Consider symptom-guided anti-ischemic pharmacotherapy as well as risk factor modification per guideline directed care. Additional analysis with CT FFR will be  submitted.  ADDENDUM: 1.  CT FFR results suggest nonobstructive CAD. 2.  CT FFR 0.85 across lesion in mid LAD 3.  CT FFR 0.88 across lesion in mid LCX  IMPRESSION: No significant extracardiac findings  Recent Labs: 04/20/2019: Creatinine, Ser 1.10  Recent Lipid Panel    Component Value Date/Time   CHOL 181 06/03/2019 1039   TRIG 148 06/03/2019 1039   HDL 35 (L) 06/03/2019 1039   CHOLHDL 5.2 (H) 06/03/2019 1039   LDLCALC 119 (H) 06/03/2019 1039    Physical Exam:    VS:  BP 128/68   Pulse 90   Temp 98.7 F (37.1 C) (Temporal)   Resp 15   Ht 5\' 10"  (1.778 m)   Wt 190 lb 9.6 oz (86.5 kg)   SpO2 96%   BMI 27.35 kg/m     Wt Readings from Last 3 Encounters:  06/03/19  190 lb 9.6 oz (86.5 kg)  02/27/19 185 lb 6.4 oz (84.1 kg)  10/16/14 175 lb (79.4 kg)     GEN:  Well nourished, well developed in no acute distress HEENT: Normal NECK: No JVD LYMPHATICS: No lymphadenopathy CARDIAC: RRR, no murmurs, rubs, gallops RESPIRATORY:  Clear to auscultation without rales, wheezing or rhonchi  ABDOMEN: Soft, non-tender, non-distended MUSCULOSKELETAL:  No edema; No deformity  SKIN: Warm and dry NEUROLOGIC:  Alert and oriented x 3 PSYCHIATRIC:  Normal affect   ASSESSMENT:    1. Coronary artery disease involving native coronary artery of native heart without angina pectoris   2. Hyperlipidemia, unspecified hyperlipidemia type   3. Essential hypertension   4. Tobacco use    PLAN:     Coronary artery disease: Presented with atypical chest pain, coronary CTA shows nonobstructive CAD  with most significant lesion being mixed plaque in mid LAD causing intermediate (50 to 69%) stenosis, CT FFR 0.85 -Continue rosuvastatin 20 mg daily.  Will check lipid panel  Hypertension: On amlodipine 10 mg daily and losartan 100 mg daily. Appears controlled  Hyperlipidemia: LDL 182 on 11/07/2018.  10-year ASCVD risk score is 35%.  Started rosuvastatin 20 mg daily in 02/2019, will check lipid  panel  Tobacco use: Smoked 1 pack/day x 45 years, quit in September but now smoking 2 cigars/day.  Patient counseled on the risk of smoking cessation strongly encouraged  RTC in 6 months  Medication Adjustments/Labs and Tests Ordered: Current medicines are reviewed at length with the patient today.  Concerns regarding medicines are outlined above.  Orders Placed This Encounter  Procedures  . Lipid panel   No orders of the defined types were placed in this encounter.   Patient Instructions  Medication Instructions:  Your physician recommends that you continue on your current medications as directed. Please refer to the Current Medication list given to you today.  *If you need a refill on your cardiac medications before your next appointment, please call your pharmacy*   Lab Work: TODAY (Lipid panel) If you have labs (blood work) drawn today and your tests are completely normal, you will receive your results only by: Marland Kitchen MyChart Message (if you have MyChart) OR . A paper copy in the mail If you have any lab test that is abnormal or we need to change your treatment, we will call you to review the results.   Testing/Procedures: NONE   Follow-Up: At [facility], you and your health needs are our priority.  As part of our continuing mission to provide you with exceptional heart care, we have created designated Provider Care Teams.  These Care Teams include your primary Cardiologist (physician) and Advanced Practice Providers (APPs -  Physician Assistants and Nurse Practitioners) who all work together to provide you with the care you need, when you need it.  We recommend signing up for the patient portal called "MyChart".  Sign up information is provided on this After Visit Summary.  MyChart is used to connect with patients for Virtual Visits (Telemedicine).  Patients are able to view lab/test results, encounter notes, upcoming appointments, etc.  Non-urgent messages can be sent to  your provider as well.   To learn more about what you can do with MyChart, go to [email or web address].    Your next appointment:   6 month(s)  The format for your next appointment:   In Person  Provider:   Epifanio Lesches, MD        Signed, Little Ishikawa, MD  06/05/2019 5:50 PM    [facility]

## 2019-06-03 ENCOUNTER — Encounter: Payer: Self-pay | Admitting: Cardiology

## 2019-06-03 ENCOUNTER — Other Ambulatory Visit: Payer: Self-pay

## 2019-06-03 ENCOUNTER — Ambulatory Visit (INDEPENDENT_AMBULATORY_CARE_PROVIDER_SITE_OTHER): Payer: Medicare Other | Admitting: Cardiology

## 2019-06-03 VITALS — BP 128/68 | HR 90 | Temp 98.7°F | Resp 15 | Ht 70.0 in | Wt 190.6 lb

## 2019-06-03 DIAGNOSIS — E785 Hyperlipidemia, unspecified: Secondary | ICD-10-CM | POA: Diagnosis not present

## 2019-06-03 DIAGNOSIS — I10 Essential (primary) hypertension: Secondary | ICD-10-CM | POA: Diagnosis not present

## 2019-06-03 DIAGNOSIS — Z720 Tobacco use: Secondary | ICD-10-CM

## 2019-06-03 DIAGNOSIS — I2510 Atherosclerotic heart disease of native coronary artery without angina pectoris: Secondary | ICD-10-CM

## 2019-06-03 LAB — LIPID PANEL
Chol/HDL Ratio: 5.2 ratio — ABNORMAL HIGH (ref 0.0–5.0)
Cholesterol, Total: 181 mg/dL (ref 100–199)
HDL: 35 mg/dL — ABNORMAL LOW (ref >39)
LDL Chol Calc (NIH): 119 mg/dL — ABNORMAL HIGH (ref 0–99)
Triglycerides: 148 mg/dL (ref 0–149)
VLDL Cholesterol Cal: 27 mg/dL (ref 5–40)

## 2019-06-03 NOTE — Patient Instructions (Signed)
Medication Instructions:  Your physician recommends that you continue on your current medications as directed. Please refer to the Current Medication list given to you today.  *If you need a refill on your cardiac medications before your next appointment, please call your pharmacy*   Lab Work: TODAY (Lipid panel) If you have labs (blood work) drawn today and your tests are completely normal, you will receive your results only by: Marland Kitchen MyChart Message (if you have MyChart) OR . A paper copy in the mail If you have any lab test that is abnormal or we need to change your treatment, we will call you to review the results.   Testing/Procedures: NONE   Follow-Up: At [facility], you and your health needs are our priority.  As part of our continuing mission to provide you with exceptional heart care, we have created designated Provider Care Teams.  These Care Teams include your primary Cardiologist (physician) and Advanced Practice Providers (APPs -  Physician Assistants and Nurse Practitioners) who all work together to provide you with the care you need, when you need it.  We recommend signing up for the patient portal called "MyChart".  Sign up information is provided on this After Visit Summary.  MyChart is used to connect with patients for Virtual Visits (Telemedicine).  Patients are able to view lab/test results, encounter notes, upcoming appointments, etc.  Non-urgent messages can be sent to your provider as well.   To learn more about what you can do with MyChart, go to [email or web address].    Your next appointment:   6 month(s)  The format for your next appointment:   In Person  Provider:   Epifanio Lesches, MD

## 2019-06-05 ENCOUNTER — Other Ambulatory Visit: Payer: Self-pay | Admitting: *Deleted

## 2019-06-05 MED ORDER — ROSUVASTATIN CALCIUM 40 MG PO TABS: 40.0000 mg | ORAL_TABLET | Freq: Every day | ORAL | 3 refills | Status: AC

## 2019-10-15 ENCOUNTER — Telehealth: Payer: Self-pay | Admitting: *Deleted

## 2019-10-15 NOTE — Telephone Encounter (Signed)
A detailed message was left, re: follow up visit. 

## 2019-11-03 ENCOUNTER — Telehealth: Payer: Self-pay | Admitting: Cardiology

## 2019-11-03 NOTE — Telephone Encounter (Signed)
LVM for patient to return call to get follow up scheduled with Schumann from recall list 

## 2019-11-04 ENCOUNTER — Telehealth: Payer: Self-pay | Admitting: Cardiology

## 2019-11-04 NOTE — Telephone Encounter (Signed)
LVM for patient to return call to get follow up scheduled with Schumann from recall list 

## 2019-11-16 ENCOUNTER — Telehealth: Payer: Self-pay | Admitting: Cardiology

## 2019-11-16 NOTE — Telephone Encounter (Signed)
lvm for patient to return call to get follow up scheduled with Schumann from recall list 

## 2019-12-21 ENCOUNTER — Telehealth: Payer: Self-pay | Admitting: Cardiology

## 2019-12-21 NOTE — Telephone Encounter (Signed)
lvm for patient to return call to get follow up scheduled with Schumann from recall list 

## 2020-01-04 ENCOUNTER — Telehealth: Payer: Self-pay | Admitting: Cardiology

## 2020-01-04 NOTE — Telephone Encounter (Signed)
Left message for patient to call and schedule follow up appointment with D>r Schumann 

## 2020-01-13 ENCOUNTER — Telehealth: Payer: Self-pay | Admitting: Cardiology

## 2020-01-13 NOTE — Telephone Encounter (Signed)
Left message for patient to call and schedule follow up appointment with D>r Bjorn Pippin

## 2021-03-12 IMAGING — CT CT HEART MORP W/ CTA COR W/ SCORE W/ CA W/CM &/OR W/O CM
4 of 7 series · 8 of 20 positions shown, 9 images · IV contrast (APPLIED)
Comparison: None.
COMPARISON: None.
COMPARISON: None.

Addendum:
EXAM:
OVER-READ INTERPRETATION  CT CHEST

The following report is an over-read performed by radiologist Dr.
Zimanyi Aggot [REDACTED] on 04/20/2019. This
over-read does not include interpretation of cardiac or coronary
anatomy or pathology. The coronary CTA interpretation by the
cardiologist is attached.
CLINICAL DATA: 68M presents with chest pain
Cardiac/Coronary CTA
TECHNIQUE: The patient was scanned on a [employer] Force scanner.

[Series 6: best diast 76 % · axial · 0.39mm/px · z∈[+1194,+1234]mm · 2 of 301 slices shown, 3 images]
[im 101/301  vessel]
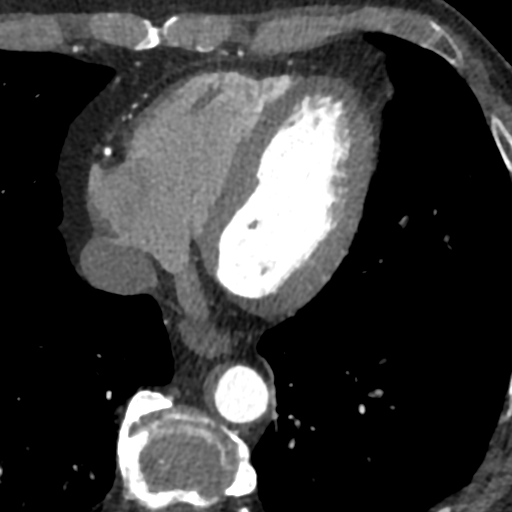
[im 101/301  lung]
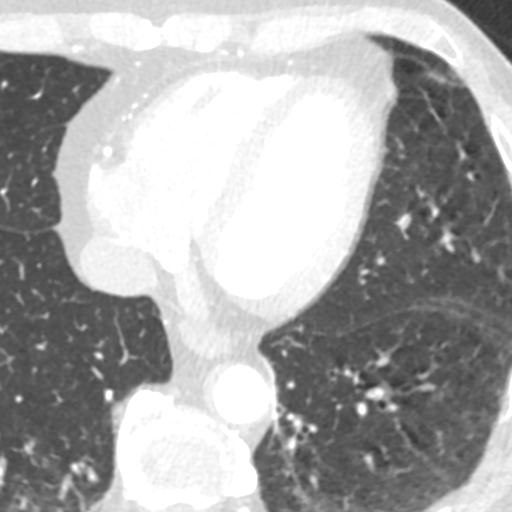
[im 201/301  vessel]
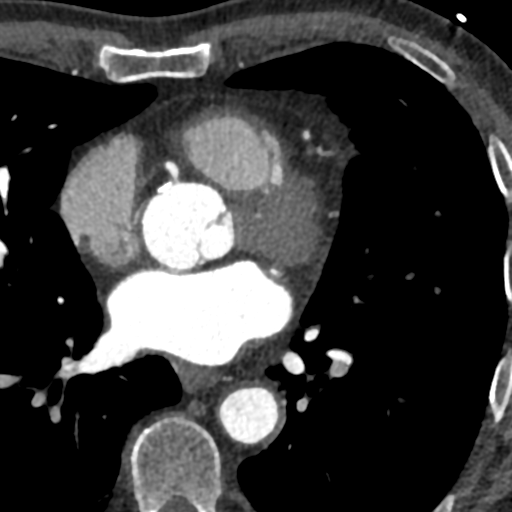

[Series 7: best syst 37 % · axial · 0.39mm/px · z∈[+1194,+1234]mm · 2 of 301 slices shown]
[im 101/301  vessel]
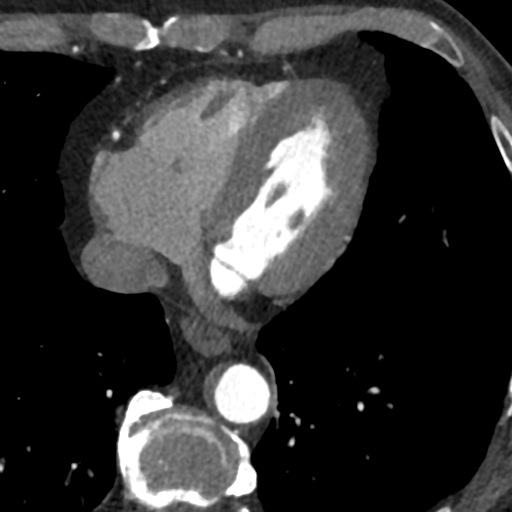
[im 201/301  vessel]
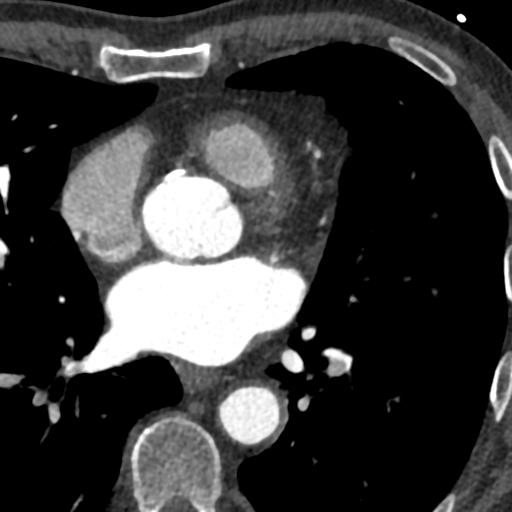

[Series 8: ts diast sharp 37 % · axial · 0.39mm/px · z∈[+1194,+1234]mm · 2 of 301 slices shown]
[im 101/301  lung]
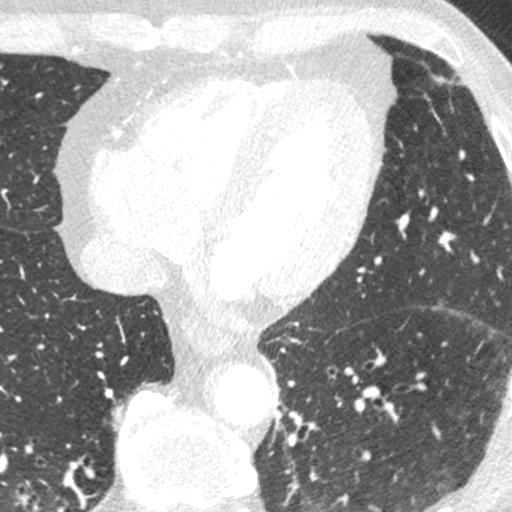
[im 201/301  lung]
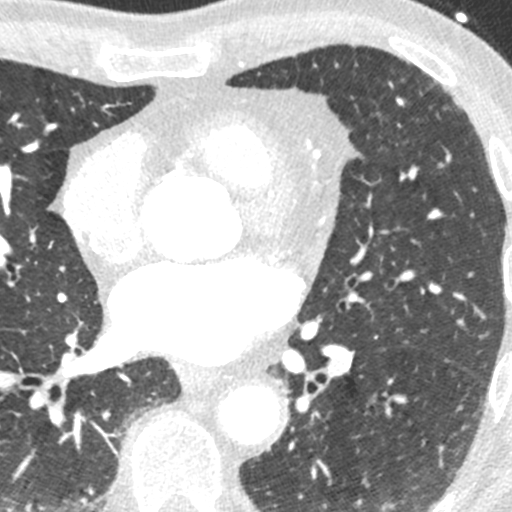

[Series 9: ts syst sharp 37 % · axial · 0.39mm/px · z∈[+1194,+1234]mm · 2 of 301 slices shown]
[im 101/301  lung]
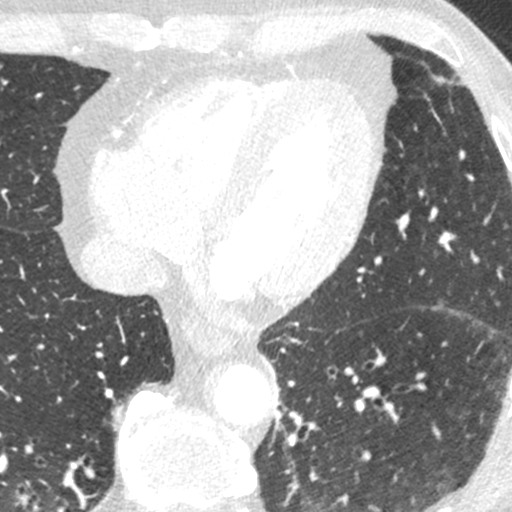
[im 201/301  lung]
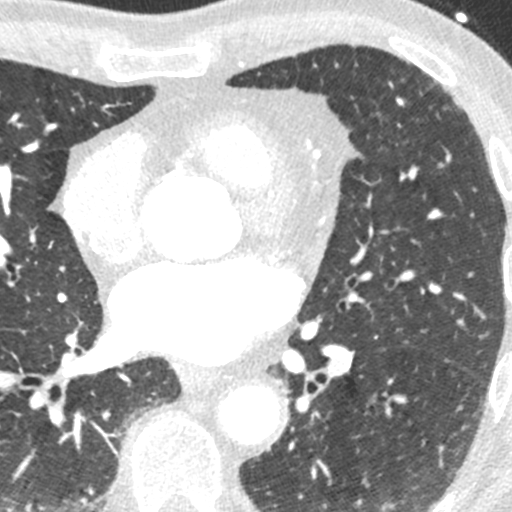

[8 of 20 positions shown; findings below may reference images not displayed]

FINDINGS: Limited view of the lung parenchyma demonstrates no suspicious
nodularity. Airways are normal.

Limited view of the mediastinum demonstrates no adenopathy.
Esophagus normal.

Limited view of the upper abdomen unremarkable.

Limited view of the skeleton and chest wall is unremarkable.
IMPRESSION: No significant extracardiac findings
FINDINGS: A 100 kV prospective scan was triggered in the descending thoracic
aorta at 111 HU's. Axial non-contrast 3 mm slices were carried out
through the heart. The data set was analyzed on a dedicated work
station and scored using the Agatson method. Gantry rotation speed
was 250 msecs and collimation was .6 mm. 0.8 mg of sl NTG was given.
The 3D data set was reconstructed in 5% intervals of the 67-82 % of
the R-R cycle. Diastolic phases were analyzed on a dedicated work
station using MPR, MIP and VRT modes. The patient received 80 cc of
contrast.

Coronary Arteries:  Normal coronary origin.  Right dominance.

RCA is a large dominant artery that gives rise to PDA and PLA. There
is calcified plaque at the ostium causing 0-24% stenosis.

Left main is a large artery that gives rise to LAD and LCX arteries.

LAD is a large vessel. Calcified plaque in the proximal LAD causes
0-24% stenosis. Mixed plaque in the mid LAD causes 50-69% stenosis.

LCX is a non-dominant artery. There is mixed plaque in the proximal
LCX causing 0-24% stenosis. There is calcified plaque in the mid LCX
causing 0-24% stenosis.

Other findings:

Left Ventricle: Normal size

Left Atrium: Normal size

Pulmonary Veins: Normal configuration

Right Ventricle: Normal size

Right Atrium: Normal size

Thoracic aorta: Normal size

Pulmonary Arteries: Normal size

Systemic Veins: Normal drainage

Pericardium: Normal thickness
IMPRESSION: 1. Coronary calcium score of 95. This was 49th percentile for age
and sex matched control.

2. Normal coronary origin with right dominance.

3. Nonobstructive CAD. Most significant lesion is mixed plaque in
mid LAD causing intermediate (50-69%) stenosis.

CAD-RADS 3. Moderate stenosis. Consider symptom-guided anti-ischemic
pharmacotherapy as well as risk factor modification per guideline
directed care. Additional analysis with CT FFR will be submitted.

ADDENDUM:
1.  CT FFR results suggest nonobstructive CAD.

2.  CT FFR 0.85 across lesion in mid LAD

3.  CT FFR 0.88 across lesion in mid LCX

*** End of Addendum ***
Addendum:
EXAM:
OVER-READ INTERPRETATION  CT CHEST

The following report is an over-read performed by radiologist Dr.
Zimanyi Aggot [REDACTED] on 04/20/2019. This
over-read does not include interpretation of cardiac or coronary
anatomy or pathology. The coronary CTA interpretation by the
cardiologist is attached.
FINDINGS: Limited view of the lung parenchyma demonstrates no suspicious
nodularity. Airways are normal.

Limited view of the mediastinum demonstrates no adenopathy.
Esophagus normal.

Limited view of the upper abdomen unremarkable.

Limited view of the skeleton and chest wall is unremarkable.
IMPRESSION: No significant extracardiac findings
FINDINGS: A 100 kV prospective scan was triggered in the descending thoracic
aorta at 111 HU's. Axial non-contrast 3 mm slices were carried out
through the heart. The data set was analyzed on a dedicated work
station and scored using the Agatson method. Gantry rotation speed
was 250 msecs and collimation was .6 mm. 0.8 mg of sl NTG was given.
The 3D data set was reconstructed in 5% intervals of the 67-82 % of
the R-R cycle. Diastolic phases were analyzed on a dedicated work
station using MPR, MIP and VRT modes. The patient received 80 cc of
contrast.

Coronary Arteries:  Normal coronary origin.  Right dominance.

RCA is a large dominant artery that gives rise to PDA and PLA. There
is calcified plaque at the ostium causing 0-24% stenosis.

Left main is a large artery that gives rise to LAD and LCX arteries.

LAD is a large vessel. Calcified plaque in the proximal LAD causes
0-24% stenosis. Mixed plaque in the mid LAD causes 50-69% stenosis.

LCX is a non-dominant artery. There is mixed plaque in the proximal
LCX causing 0-24% stenosis. There is calcified plaque in the mid LCX
causing 0-24% stenosis.

Other findings:

Left Ventricle: Normal size

Left Atrium: Normal size

Pulmonary Veins: Normal configuration

Right Ventricle: Normal size

Right Atrium: Normal size

Thoracic aorta: Normal size

Pulmonary Arteries: Normal size

Systemic Veins: Normal drainage

Pericardium: Normal thickness
IMPRESSION: 1. Coronary calcium score of 95. This was 49th percentile for age
and sex matched control.

2. Normal coronary origin with right dominance.

3. Nonobstructive CAD. Most significant lesion is mixed plaque in
mid LAD causing intermediate (50-69%) stenosis.

CAD-RADS 3. Moderate stenosis. Consider symptom-guided anti-ischemic
pharmacotherapy as well as risk factor modification per guideline
directed care. Additional analysis with CT FFR will be submitted.

*** End of Addendum ***
EXAM:
OVER-READ INTERPRETATION  CT CHEST

The following report is an over-read performed by radiologist Dr.
Zimanyi Aggot [REDACTED] on 04/20/2019. This
over-read does not include interpretation of cardiac or coronary
anatomy or pathology. The coronary CTA interpretation by the
cardiologist is attached.
FINDINGS: Limited view of the lung parenchyma demonstrates no suspicious
nodularity. Airways are normal.

Limited view of the mediastinum demonstrates no adenopathy.
Esophagus normal.

Limited view of the upper abdomen unremarkable.

Limited view of the skeleton and chest wall is unremarkable.
IMPRESSION: No significant extracardiac findings

## 2021-12-05 ENCOUNTER — Other Ambulatory Visit: Payer: Self-pay | Admitting: Family Medicine

## 2021-12-11 ENCOUNTER — Other Ambulatory Visit: Payer: Self-pay | Admitting: Family Medicine

## 2021-12-11 DIAGNOSIS — I714 Abdominal aortic aneurysm, without rupture, unspecified: Secondary | ICD-10-CM

## 2021-12-19 ENCOUNTER — Ambulatory Visit
Admission: RE | Admit: 2021-12-19 | Discharge: 2021-12-19 | Disposition: A | Discharge status: Home / Self Care | Payer: Medicare Other | Source: Ambulatory Visit | Attending: Family Medicine | Admitting: Family Medicine

## 2021-12-19 DIAGNOSIS — I714 Abdominal aortic aneurysm, without rupture, unspecified: Secondary | ICD-10-CM

## 2021-12-28 ENCOUNTER — Other Ambulatory Visit: Payer: Self-pay | Admitting: Family Medicine

## 2021-12-28 DIAGNOSIS — I719 Aortic aneurysm of unspecified site, without rupture: Secondary | ICD-10-CM

## 2022-08-03 ENCOUNTER — Emergency Department (HOSPITAL_COMMUNITY): Payer: Medicare Other

## 2022-08-03 ENCOUNTER — Emergency Department (HOSPITAL_COMMUNITY)
Admission: EM | Admit: 2022-08-03 | Discharge: 2022-08-03 | Disposition: A | Discharge status: Home / Self Care | Payer: Medicare Other | Attending: Emergency Medicine | Admitting: Emergency Medicine

## 2022-08-03 ENCOUNTER — Other Ambulatory Visit: Payer: Self-pay

## 2022-08-03 ENCOUNTER — Encounter (HOSPITAL_COMMUNITY): Payer: Self-pay

## 2022-08-03 DIAGNOSIS — I10 Essential (primary) hypertension: Secondary | ICD-10-CM | POA: Diagnosis not present

## 2022-08-03 DIAGNOSIS — F17200 Nicotine dependence, unspecified, uncomplicated: Secondary | ICD-10-CM | POA: Diagnosis not present

## 2022-08-03 DIAGNOSIS — Z79899 Other long term (current) drug therapy: Secondary | ICD-10-CM | POA: Insufficient documentation

## 2022-08-03 DIAGNOSIS — R079 Chest pain, unspecified: Secondary | ICD-10-CM | POA: Diagnosis present

## 2022-08-03 DIAGNOSIS — I2510 Atherosclerotic heart disease of native coronary artery without angina pectoris: Secondary | ICD-10-CM | POA: Diagnosis not present

## 2022-08-03 DIAGNOSIS — T148XXA Other injury of unspecified body region, initial encounter: Secondary | ICD-10-CM | POA: Diagnosis not present

## 2022-08-03 DIAGNOSIS — M79605 Pain in left leg: Secondary | ICD-10-CM | POA: Diagnosis not present

## 2022-08-03 DIAGNOSIS — X58XXXA Exposure to other specified factors, initial encounter: Secondary | ICD-10-CM | POA: Diagnosis not present

## 2022-08-03 DIAGNOSIS — R0602 Shortness of breath: Secondary | ICD-10-CM | POA: Diagnosis not present

## 2022-08-03 LAB — CBC
HCT: 49.1 % (ref 39.0–52.0)
Hemoglobin: 16 g/dL (ref 13.0–17.0)
MCH: 29 pg (ref 26.0–34.0)
MCHC: 32.6 g/dL (ref 30.0–36.0)
MCV: 88.9 fL (ref 80.0–100.0)
Platelets: 254 10*3/uL (ref 150–400)
RBC: 5.52 MIL/uL (ref 4.22–5.81)
RDW: 13.8 % (ref 11.5–15.5)
WBC: 10.2 10*3/uL (ref 4.0–10.5)
nRBC: 0 % (ref 0.0–0.2)

## 2022-08-03 LAB — BASIC METABOLIC PANEL
Anion gap: 12 (ref 5–15)
BUN: 11 mg/dL (ref 8–23)
CO2: 22 mmol/L (ref 22–32)
Calcium: 9.6 mg/dL (ref 8.9–10.3)
Chloride: 104 mmol/L (ref 98–111)
Creatinine, Ser: 1.11 mg/dL (ref 0.61–1.24)
GFR, Estimated: >60 mL/min (ref >60)
Glucose, Bld: 120 mg/dL — ABNORMAL HIGH (ref 70–99)
Potassium: 3.9 mmol/L (ref 3.5–5.1)
Sodium: 138 mmol/L (ref 135–145)

## 2022-08-03 LAB — TROPONIN I (HIGH SENSITIVITY)
Troponin I (High Sensitivity): 4 ng/L (ref <18)
Troponin I (High Sensitivity): 3 ng/L (ref <18)

## 2022-08-03 LAB — BRAIN NATRIURETIC PEPTIDE: B Natriuretic Peptide: 52.3 pg/mL (ref 0.0–100.0)

## 2022-08-03 MED ORDER — IOHEXOL 350 MG/ML SOLN
75.0000 mL | Freq: Once | INTRAVENOUS | Status: AC | PRN
  Administered 2022-08-03: 75 mL via INTRAVENOUS

## 2022-08-03 MED ORDER — METHOCARBAMOL 500 MG PO TABS: 500.0000 mg | ORAL_TABLET | Freq: Two times a day (BID) | ORAL | 0 refills | Status: AC

## 2022-08-03 MED ORDER — METHOCARBAMOL 500 MG PO TABS: 1000.0000 mg | ORAL_TABLET | Freq: Two times a day (BID) | ORAL | 0 refills | Status: DC

## 2022-08-03 MED ORDER — ACETAMINOPHEN 325 MG PO TABS: 650.0000 mg | ORAL_TABLET | Freq: Four times a day (QID) | ORAL | 0 refills | Status: AC | PRN

## 2022-08-03 MED ORDER — IPRATROPIUM-ALBUTEROL 0.5-2.5 (3) MG/3ML IN SOLN
3.0000 mL | Freq: Once | RESPIRATORY_TRACT | Status: AC
  Administered 2022-08-03: 3 mL via RESPIRATORY_TRACT
  Filled 2022-08-03: qty 3

## 2022-08-03 NOTE — ED Triage Notes (Addendum)
Patient began having cramps in his left leg that has been hurting for a week. Pain moved to the left side of his chest that has been off and on for a week. Feeling short of breath and anxious.

## 2022-08-03 NOTE — ED Provider Notes (Signed)
[place] EMERGENCY DEPARTMENT AT [facility] Provider Note  CSN: [id number] Arrival date & time: 08/03/22 4098  Chief Complaint(s) Chest Pain  HPI Edward Moran is a 72 y.o. male with past medical history as below, significant for tobacco use, hyperlipidemia, hypertension, CAD who presents to the ED with complaint of left leg pain, chest pain, dyspnea.  Patient intermittent alcohol use, last alcohol use was last night, approximately 3 shots.  He smokes daily.  Reports has been having left Calf pain over the past few weeks, no significant leg swelling.  Thinks he may have pulled a muscle in his calf while at work.  Began having initially intermittent then constant left mammary chest pain described as sharp, aching at times.  Unable to identify alleviating exacerbating factors.  He was told to come to the hospital by his boss because he looked unwell.  Upon arrival he was having difficulty breathing, patient thinks that he was having a panic attack she is worried about being in the hospital.  On my assessment he reports that his breathing has greatly improved, he is feeling better overall.  No nausea, vomiting, syncope or near syncope, no palpitations, no current dyspnea    Past Medical History Past Medical History:  Diagnosis Date   Hyperlipidemia    Hypertension    There are no problems to display for this patient.  Home Medication(s) Prior to Admission medications   Medication Sig Start Date End Date Taking? Authorizing Provider  acetaminophen (TYLENOL) 325 MG tablet Take 2 tablets (650 mg total) by mouth every 6 (six) hours as needed. 08/03/22  Yes Tanda Rockers A, DO  amLODipine (NORVASC) 10 MG tablet Take 1 tablet (10 mg total) by mouth daily. 02/27/19   Little Ishikawa, MD  lansoprazole (PREVACID) 15 MG capsule Take 15 mg by mouth daily as needed.    [provider]  losartan (COZAAR) 100 MG tablet  10/29/18   [provider]  methocarbamol  (ROBAXIN) 500 MG tablet Take 1 tablet (500 mg total) by mouth 2 (two) times daily for 5 days. 08/03/22 08/08/22  Tanda Rockers A, DO  metoprolol tartrate (LOPRESSOR) 100 MG tablet Take 1 tablet (100 mg total) by mouth as directed. 2 hours prior to CTA 02/27/19 06/03/19  Little Ishikawa, MD  rosuvastatin (CRESTOR) 40 MG tablet Take 1 tablet (40 mg total) by mouth daily. 06/05/19 05/30/20  Little Ishikawa, MD                                                                                                                                    Past Surgical History History reviewed. No pertinent surgical history. Family History History reviewed. No pertinent family history.  Social History Social History   Tobacco Use   Smoking status: Every Day    Types: Cigars   Smokeless tobacco: Never  Substance Use Topics   Alcohol use: Yes  Drug use: No   Allergies Patient has no known allergies.  Review of Systems Review of Systems  Constitutional:  Negative for chills and fever.  HENT:  Negative for facial swelling and trouble swallowing.   Eyes:  Negative for photophobia and visual disturbance.  Respiratory:  Positive for chest tightness and shortness of breath. Negative for cough.   Cardiovascular:  Positive for chest pain. Negative for palpitations.  Gastrointestinal:  Negative for abdominal pain, nausea and vomiting.  Endocrine: Negative for polydipsia and polyuria.  Genitourinary:  Negative for difficulty urinating and hematuria.  Musculoskeletal:  Positive for myalgias. Negative for gait problem and joint swelling.  Skin:  Negative for pallor and rash.  Neurological:  Negative for syncope and headaches.  Psychiatric/Behavioral:  Negative for agitation and confusion. The patient is nervous/anxious.     Physical Exam Vital Signs  I have reviewed the triage vital signs BP (!) 151/78   Pulse 69   Temp 97.8 F (36.6 C) (Oral)   Resp 13   Ht 5\' 10"  (1.778 m)   Wt 85.7 kg    SpO2 97%   BMI 27.12 kg/m  Physical Exam Vitals and nursing note reviewed.  Constitutional:      General: He is not in acute distress.    Appearance: He is well-developed.  HENT:     Head: Normocephalic and atraumatic.     Right Ear: External ear normal.     Left Ear: External ear normal.     Mouth/Throat:     Mouth: Mucous membranes are moist.  Eyes:     General: No scleral icterus. Cardiovascular:     Rate and Rhythm: Normal rate and regular rhythm.     Pulses: Normal pulses.     Heart sounds: Normal heart sounds.  Pulmonary:     Effort: Pulmonary effort is normal. No tachypnea or respiratory distress.     Breath sounds: Normal breath sounds.  Abdominal:     General: Abdomen is flat.     Palpations: Abdomen is soft.     Tenderness: There is no abdominal tenderness.  Musculoskeletal:        General: Normal range of motion.     Right lower leg: No edema.     Left lower leg: No edema.  Skin:    General: Skin is warm and dry.     Capillary Refill: Capillary refill takes less than 2 seconds.  Neurological:     Mental Status: He is alert and oriented to person, place, and time.     GCS: GCS eye subscore is 4. GCS verbal subscore is 5. GCS motor subscore is 6.  Psychiatric:        Mood and Affect: Mood normal.        Behavior: Behavior normal.     ED Results and Treatments Labs (all labs ordered are listed, but only abnormal results are displayed) Labs Reviewed  BASIC METABOLIC PANEL - Abnormal; Notable for the following components:      Result Value   Glucose, Bld 120 (*)    All other components within normal limits  CBC  BRAIN NATRIURETIC PEPTIDE  TROPONIN I (HIGH SENSITIVITY)  TROPONIN I (HIGH SENSITIVITY)  Radiology CT Angio Chest PE W and/or Wo Contrast  Result Date: 08/03/2022 CLINICAL DATA:  Intermittent left-sided chest pain for the  past week with shortness of breath. EXAM: CT ANGIOGRAPHY CHEST WITH CONTRAST TECHNIQUE: Multidetector CT imaging of the chest was performed using the standard protocol during bolus administration of intravenous contrast. Multiplanar CT image reconstructions and MIPs were obtained to evaluate the vascular anatomy. RADIATION DOSE REDUCTION: This exam was performed according to the departmental dose-optimization program which includes automated exposure control, adjustment of the mA and/or kV according to patient size and/or use of iterative reconstruction technique. CONTRAST:  75mL OMNIPAQUE IOHEXOL 350 MG/ML SOLN COMPARISON:  Chest x-ray from same day. Cardiac CT dated April 20, 2019. FINDINGS: Cardiovascular: Satisfactory opacification of the pulmonary arteries to the segmental level. No evidence of pulmonary embolism. Normal heart size. No pericardial effusion. No thoracic aortic aneurysm or dissection. Coronary, aortic arch, and branch vessel atherosclerotic vascular disease. Mediastinum/Nodes: Mildly enlarged right hilar lymph node measuring 1.2 cm in short axis, presumably reactive. Additional prominent subcentimeter mediastinal and left hilar lymph nodes. No enlarged axillary lymph nodes. Lungs/Pleura: Mild centrilobular emphysema. Diffuse peribronchial thickening. Mild subsegmental atelectasis in both posterior lobes. No focal consolidation, pleural effusion, or pneumothorax. Upper Abdomen: No acute abnormality. Musculoskeletal: No chest wall abnormality. No acute or significant osseous findings. Review of the MIP images confirms the above findings. IMPRESSION: 1. No evidence of pulmonary embolism. No acute intrathoracic process. 2. Aortic Atherosclerosis (ICD10-I70.0) and Emphysema (ICD10-J43.9). Electronically Signed   By: Obie Dredge M.D.   On: 08/03/2022 12:08   DG Chest 2 View  Result Date: 08/03/2022 CLINICAL DATA:  Chest pain and shortness of breath. EXAM: CHEST - 2 VIEW COMPARISON:  Two-view  chest x-ray 07/15/2004 FINDINGS: The heart size is normal. Mild pulmonary vascular congestion is present. No edema or effusion is present. No focal airspace disease is present. The visualized soft tissues and bony thorax are unremarkable. IMPRESSION: Mild pulmonary vascular congestion without failure. Electronically Signed   By: Marin Roberts M.D.   On: 08/03/2022 09:00    Pertinent labs & imaging results that were available during my care of the patient were reviewed by me and considered in my medical decision making (see MDM for details).  Medications Ordered in ED Medications  ipratropium-albuterol (DUONEB) 0.5-2.5 (3) MG/3ML nebulizer solution 3 mL (3 mLs Nebulization Given 08/03/22 0904)  iohexol (OMNIPAQUE) 350 MG/ML injection 75 mL (75 mLs Intravenous Contrast Given 08/03/22 1132)                                                                                                                                     Procedures Procedures  (including critical care time)  Medical Decision Making / ED Course    Medical Decision Making:    Edward Moran is a 72 y.o. male with past medical history as below, significant for tobacco use, hyperlipidemia, hypertension, CAD who presents to  the ED with complaint of left leg pain, chest pain, dyspnea. . The complaint involves an extensive differential diagnosis and also carries with it a high risk of complications and morbidity.  Serious etiology was considered. Ddx includes but is not limited to: In my evaluation of this patient's dyspnea my DDx includes, but is not limited to, pneumonia, pulmonary embolism, pneumothorax, pulmonary edema, metabolic acidosis, asthma, COPD, cardiac cause, anemia, anxiety, etc.  Differential includes all life-threatening causes for chest pain. This includes but is not exclusive to acute coronary syndrome, aortic dissection, pulmonary embolism, cardiac tamponade, community-acquired pneumonia, pericarditis,  musculoskeletal chest wall pain, etc.   Complete initial physical exam performed, notably the patient  was breathing comfortably, initially was on nasal cannula but this has been discontinued and he is breathing well on ambient air.    Reviewed and confirmed nursing documentation for past medical history, family history, social history.  Vital signs reviewed.      He has pain to his left calf, no palpable cords, negative Homans' sign, negative swelling, cp/dib; will get ctpe   The patient's chest pain is not suggestive of pulmonary embolus, cardiac ischemia, aortic dissection, pericarditis, myocarditis, pulmonary embolism, pneumothorax, pneumonia, Zoster, or esophageal perforation, or other serious etiology.  Historically not abrupt in onset, tearing or ripping, pulses symmetric. EKG nonspecific for ischemia/infarction. No dysrhythmias, brugada, WPW, prolonged QT noted.   Troponin negative x2. CXR reviewed. Labs without demonstration of acute pathology unless otherwise noted above. Low HEART Score: 0-3 points (0.9-1.7% risk of MACE).  CTPE neg, no sig leg swelling, DVT seems unlikely at this point, PE unlikely.  Encouraged tobacco/etoh cessation  F/u cardiologist  Given the extremely low risk of these diagnoses further testing and evaluation for these possibilities does not appear to be indicated at this time. Patient in no distress and overall condition improved here in the ED. Detailed discussions were had with the patient regarding current findings, and need for close f/u with PCP or on call doctor. The patient has been instructed to return immediately if the symptoms worsen in any way for re-evaluation. Patient verbalized understanding and is in agreement with current care plan. All questions answered prior to discharge.         Additional history obtained: -Additional history obtained from na -External records from outside source obtained and reviewed including: Chart review  including previous notes, labs, imaging, consultation notes including occasions, prior labs and imaging, cardiology documentation CT coronary; 2/21, nonobstructive  Lab Tests: -I ordered, reviewed, and interpreted labs.   The pertinent results include:   Labs Reviewed  BASIC METABOLIC PANEL - Abnormal; Notable for the following components:      Result Value   Glucose, Bld 120 (*)    All other components within normal limits  CBC  BRAIN NATRIURETIC PEPTIDE  TROPONIN I (HIGH SENSITIVITY)  TROPONIN I (HIGH SENSITIVITY)    Notable for trop neg  x2  EKG   EKG Interpretation  Date/Time:    Ventricular Rate:    PR Interval:    QRS Duration:   QT Interval:    QTC Calculation:   R Axis:     Text Interpretation:           Imaging Studies ordered: I ordered imaging studies including CTPE CXR  I independently visualized the following imaging with scope of interpretation limited to determining acute life threatening conditions related to emergency care; findings noted above, significant for emphysema, atherosclerosis I independently visualized and interpreted imaging. I agree with the  radiologist interpretation   Medicines ordered and prescription drug management: Meds ordered this encounter  Medications   ipratropium-albuterol (DUONEB) 0.5-2.5 (3) MG/3ML nebulizer solution 3 mL   iohexol (OMNIPAQUE) 350 MG/ML injection 75 mL   DISCONTD: methocarbamol (ROBAXIN) 500 MG tablet    Sig: Take 2 tablets (1,000 mg total) by mouth 2 (two) times daily for 5 days.    Dispense:  20 tablet    Refill:  0   acetaminophen (TYLENOL) 325 MG tablet    Sig: Take 2 tablets (650 mg total) by mouth every 6 (six) hours as needed.    Dispense:  36 tablet    Refill:  0   methocarbamol (ROBAXIN) 500 MG tablet    Sig: Take 1 tablet (500 mg total) by mouth 2 (two) times daily for 5 days.    Dispense:  10 tablet    Refill:  0    -I have reviewed the patients home medicines and have made adjustments  as needed   Consultations Obtained: na   Cardiac Monitoring: The patient was maintained on a cardiac monitor.  I personally viewed and interpreted the cardiac monitored which showed an underlying rhythm of: NSR  Social Determinants of Health:  Diagnosis or treatment significantly limited by social determinants of health: current smoker and alcohol use Counseled patient for approximately 3 minutes regarding smoking cessation. Discussed risks of smoking and how they applied and affected their visit here today. Patient not ready to quit at this time, however will follow up with their primary doctor when they are.   CPT code: 40981: intermediate counseling for smoking cessation     Reevaluation: After the interventions noted above, I reevaluated the patient and found that they have improved  Co morbidities that complicate the patient evaluation  Past Medical History:  Diagnosis Date   Hyperlipidemia    Hypertension       Dispostion: Disposition decision including need for hospitalization was considered, and patient discharged from emergency department.    Final Clinical Impression(s) / ED Diagnoses Final diagnoses:  Chest pain, unspecified type  Muscle strain     This chart was dictated using voice recognition software.  Despite best efforts to proofread,  errors can occur which can change the documentation meaning.    Tanda Rockers A, DO 08/03/22 1330

## 2022-08-03 NOTE — Discharge Instructions (Addendum)
It was a pleasure caring for you today in the emergency department.  Please return to the emergency department for any worsening or worrisome symptoms.  Please refrain from tobacco and alcohol use in the future

## 2023-07-24 ENCOUNTER — Encounter: Payer: Self-pay | Admitting: *Deleted

## 2023-07-31 ENCOUNTER — Telehealth: Payer: Self-pay | Admitting: Acute Care

## 2023-07-31 ENCOUNTER — Other Ambulatory Visit: Payer: Self-pay

## 2023-07-31 DIAGNOSIS — Z122 Encounter for screening for malignant neoplasm of respiratory organs: Secondary | ICD-10-CM

## 2023-07-31 DIAGNOSIS — Z87891 Personal history of nicotine dependence: Secondary | ICD-10-CM

## 2023-07-31 NOTE — Telephone Encounter (Signed)
 Lung Cancer Screening Narrative/Criteria Questionnaire (Cigarette Smokers Only- No Cigars/Pipes/vapes)   Edward Moran   SDMV:08/23/23 at 0830a/[facility]                                           1950/05/06              LDCT: 08/27/23 at0830a/ [facility]    72 y.o.   Phone: [phone]  Lung Screening Narrative (confirm age 48-77 yrs Medicare / 50-80 yrs Private pay insurance)   Insurance information:medicare   Referring Provider:Swayne   This screening involves an initial phone call with a team member from our program. It is called a shared decision making visit. The initial meeting is required by insurance and Medicare to make sure you understand the program. This appointment takes about 15-20 minutes to complete. The CT scan will completed at a separate date/time. This scan takes about 5-10 minutes to complete and you may eat and drink before and after the scan.  Criteria questions for Lung Cancer Screening:   Are you a current or former smoker? Former Age began smoking: 19y   If you are a former smoker, what year did you quit smoking 07/2022 and quit 1 yr additional   To calculate your smoking history, I need an accurate estimate of how many packs of cigarettes you smoked per day and for how many years. (Not just the number of PPD you are now smoking)   Years smoking 51 x Packs per day 1 to 1 3/4  = Pack years 70   (at least 20 pack yrs)   (Make sure they understand that we need to know how much they have smoked in the past, not just the number of PPD they are smoking now)  Do you have a personal history of cancer?  No    Do you have a family history of cancer? No  Are you coughing up blood?  No  Have you had unexplained weight loss of 15 lbs or more in the last 6 months? No  It looks like you meet all criteria.     Additional information: NA

## 2023-08-23 ENCOUNTER — Ambulatory Visit: Admitting: *Deleted

## 2023-08-23 DIAGNOSIS — Z87891 Personal history of nicotine dependence: Secondary | ICD-10-CM | POA: Diagnosis not present

## 2023-08-23 NOTE — Progress Notes (Signed)
 Virtual Visit via Telephone Note  I connected with Frieda Jew on 08/23/23 at  8:30 AM EDT by telephone and verified that I am speaking with the correct person using two identifiers.  Location: Patient: Edward Moran Provider: Alyse Bach, RN   I discussed the limitations, risks, security and privacy concerns of performing an evaluation and management service by telephone and the availability of in person appointments. I also discussed with the patient that there may be a patient responsible charge related to this service. The patient expressed understanding and agreed to proceed.     Shared Decision Making Visit Lung Cancer Screening Program [id number])   Eligibility: Age 73 y.o. Pack Years Smoking History Calculation 70 (# packs/per year x # years smoked) Recent History of coughing up blood  no Unexplained weight loss? no ( >Than 15 pounds within the last 6 months ) Prior History Lung / other cancer no (Diagnosis within the last 5 years already requiring surveillance chest CT Scans). Smoking Status Former Smoker Former Smokers: Years since quit: 1 year  Quit Date: 2024  Visit Components: Discussion included one or more decision making aids. yes Discussion included risk/benefits of screening. yes Discussion included potential follow up diagnostic testing for abnormal scans. yes Discussion included meaning and risk of over diagnosis. yes Discussion included meaning and risk of False Positives. yes Discussion included meaning of total radiation exposure. yes  Counseling Included: Importance of adherence to annual lung cancer LDCT screening. yes Impact of comorbidities on ability to participate in the program. yes Ability and willingness to under diagnostic treatment. yes  Smoking Cessation Counseling: Current Smokers:  Discussed importance of smoking cessation. yes Information about tobacco cessation classes and interventions provided to patient. yes Patient provided  with "ticket" for LDCT Scan. no Symptomatic Patient. noyes  Counseling(Intermediate counseling: > three minutes) 99406 Diagnosis Code: Tobacco Use Z72.0 Asymptomatic Patient yes  Counseling (Intermediate counseling: > three minutes counseling) M0102 Former Smokers:  Discussed the importance of maintaining cigarette abstinence. yes Diagnosis Code: Personal History of Nicotine Dependence. V25.366 Information about tobacco cessation classes and interventions provided to patient. Yes Patient provided with "ticket" for LDCT Scan. no Written Order for Lung Cancer Screening with LDCT placed in Epic. Yes (CT Chest Lung Cancer Screening Low Dose W/O CM) [id number] Z12.2-Screening of respiratory organs Z87.891-Personal history of nicotine dependence   Alyse Bach, RN

## 2023-08-23 NOTE — Patient Instructions (Signed)

## 2023-08-27 ENCOUNTER — Ambulatory Visit (HOSPITAL_COMMUNITY)
Admission: RE | Admit: 2023-08-27 | Discharge: 2023-08-27 | Disposition: A | Discharge status: Home / Self Care | Source: Ambulatory Visit | Attending: Family Medicine | Admitting: Family Medicine

## 2023-08-27 ENCOUNTER — Encounter (HOSPITAL_COMMUNITY): Payer: Self-pay

## 2023-08-27 DIAGNOSIS — Z122 Encounter for screening for malignant neoplasm of respiratory organs: Secondary | ICD-10-CM | POA: Insufficient documentation

## 2023-08-27 DIAGNOSIS — Z87891 Personal history of nicotine dependence: Secondary | ICD-10-CM | POA: Insufficient documentation

## 2023-09-09 ENCOUNTER — Other Ambulatory Visit: Payer: Self-pay | Admitting: Acute Care

## 2023-09-09 DIAGNOSIS — Z87891 Personal history of nicotine dependence: Secondary | ICD-10-CM

## 2023-09-09 DIAGNOSIS — Z122 Encounter for screening for malignant neoplasm of respiratory organs: Secondary | ICD-10-CM

## 2023-10-28 NOTE — Progress Notes (Addendum)
 Tobacco counseling provided (Intermediate counseling: 3 minutes) [id number]
